# Patient Record
Sex: Male | Born: 1965
Health system: Southern US, Community
[De-identification: ages and names within clinical notes are randomized; demographics above are authoritative.]

## PROBLEM LIST (undated history)

## (undated) DIAGNOSIS — M1990 Unspecified osteoarthritis, unspecified site: Secondary | ICD-10-CM

## (undated) DIAGNOSIS — R32 Unspecified urinary incontinence: Secondary | ICD-10-CM

## (undated) DIAGNOSIS — G629 Polyneuropathy, unspecified: Secondary | ICD-10-CM

## (undated) DIAGNOSIS — K769 Liver disease, unspecified: Secondary | ICD-10-CM

## (undated) DIAGNOSIS — Y249XXA Unspecified firearm discharge, undetermined intent, initial encounter: Secondary | ICD-10-CM

## (undated) DIAGNOSIS — W3400XA Accidental discharge from unspecified firearms or gun, initial encounter: Secondary | ICD-10-CM

## (undated) DIAGNOSIS — M549 Dorsalgia, unspecified: Secondary | ICD-10-CM

## (undated) HISTORY — PX: ANKLE SURGERY: SHX546

## (undated) HISTORY — PX: HAND SURGERY: SHX662

## (undated) HISTORY — PX: BACK SURGERY: SHX140

## (undated) HISTORY — PX: HERNIA REPAIR: SHX51

## (undated) HISTORY — PX: KNEE SURGERY: SHX244

---

## 2007-05-29 ENCOUNTER — Ambulatory Visit (HOSPITAL_BASED_OUTPATIENT_CLINIC_OR_DEPARTMENT_OTHER): Admission: RE | Admit: 2007-05-29 | Discharge: 2007-05-29 | Payer: Self-pay | Admitting: Urology

## 2008-06-18 ENCOUNTER — Encounter: Payer: Self-pay | Admitting: Family Medicine

## 2009-02-14 ENCOUNTER — Ambulatory Visit: Payer: Self-pay | Admitting: Diagnostic Radiology

## 2009-02-14 ENCOUNTER — Emergency Department (HOSPITAL_BASED_OUTPATIENT_CLINIC_OR_DEPARTMENT_OTHER): Admission: EM | Admit: 2009-02-14 | Discharge: 2009-02-14 | Payer: Self-pay | Admitting: Emergency Medicine

## 2009-05-24 ENCOUNTER — Ambulatory Visit: Payer: Self-pay | Admitting: Occupational Medicine

## 2009-05-24 DIAGNOSIS — K4041 Unilateral inguinal hernia, with gangrene, recurrent: Secondary | ICD-10-CM | POA: Insufficient documentation

## 2009-05-30 ENCOUNTER — Ambulatory Visit: Payer: Self-pay | Admitting: Family Medicine

## 2009-05-30 DIAGNOSIS — M549 Dorsalgia, unspecified: Secondary | ICD-10-CM | POA: Insufficient documentation

## 2009-06-03 ENCOUNTER — Ambulatory Visit: Payer: Self-pay | Admitting: Family Medicine

## 2009-06-19 ENCOUNTER — Ambulatory Visit: Payer: Self-pay | Admitting: Family Medicine

## 2009-06-25 ENCOUNTER — Encounter: Payer: Self-pay | Admitting: Family Medicine

## 2009-07-15 ENCOUNTER — Encounter: Payer: Self-pay | Admitting: Family Medicine

## 2009-07-20 ENCOUNTER — Encounter: Payer: Self-pay | Admitting: Family Medicine

## 2009-08-11 ENCOUNTER — Encounter: Payer: Self-pay | Admitting: Family Medicine

## 2010-03-28 IMAGING — CR DG LUMBAR SPINE COMPLETE 4+V
6 series · 6 of 6 positions shown · non-contrast
Comparison: None.

CLINICAL DATA: Low back pain following a fall today.  Previous
lumbar spine surgery.

LUMBAR SPINE - COMPLETE 4+ VIEW

[t l-spine a.p.]
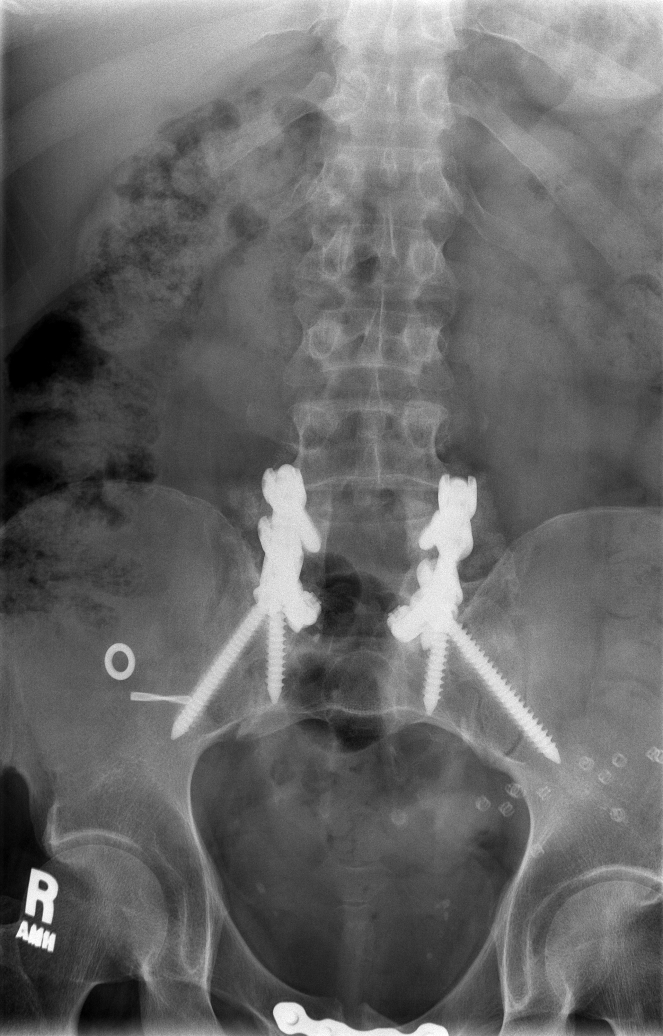

[t l-spine oblique exposure (1 of 2)]
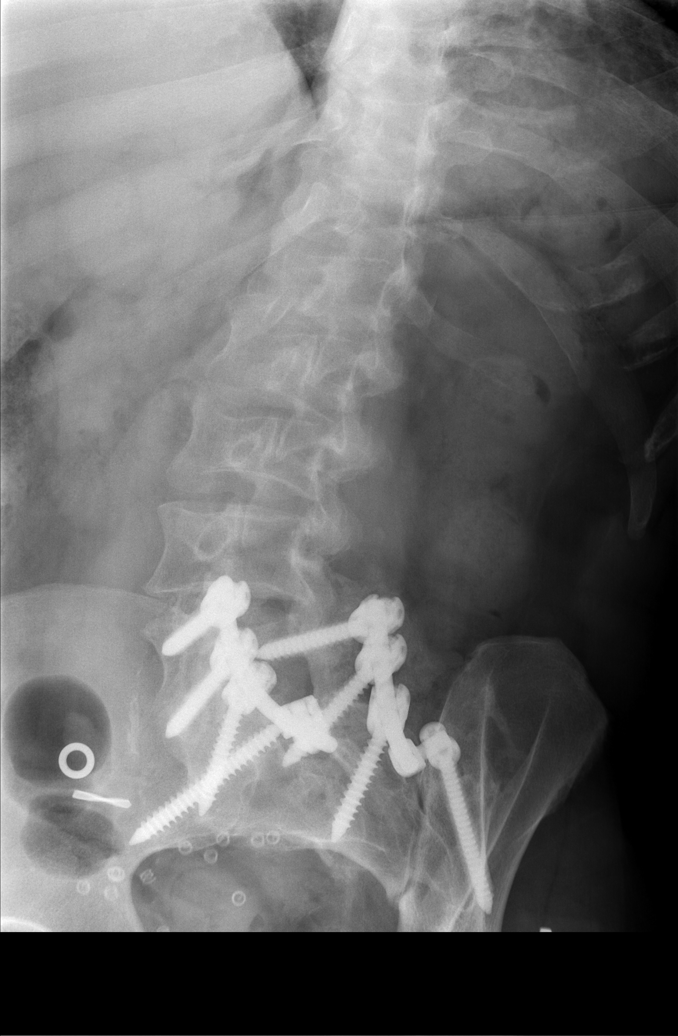

[t l-spine oblique exposure (2 of 2)]
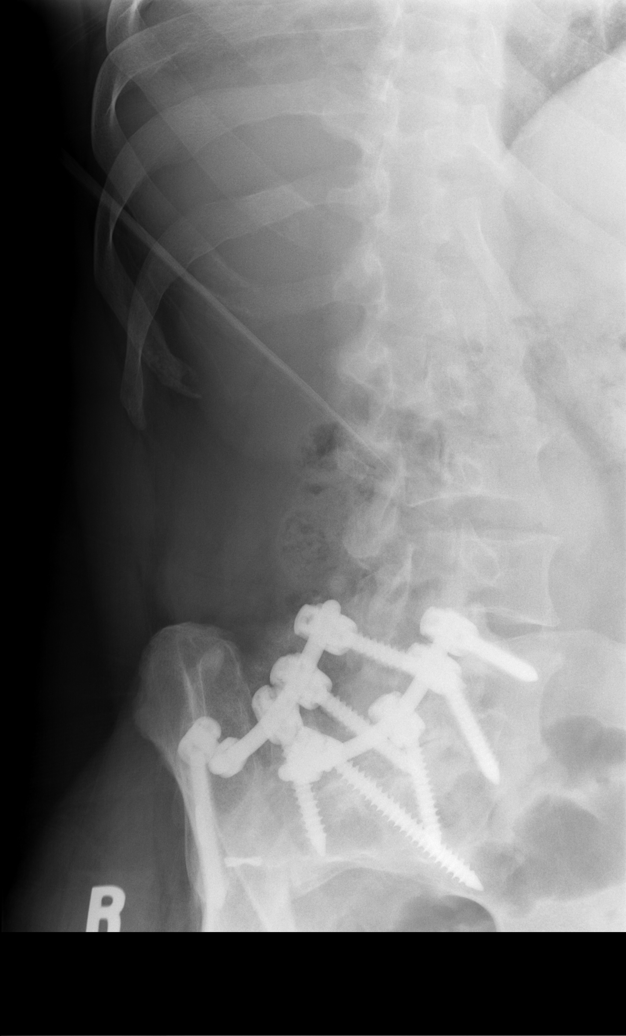

[t l-spine lat (1 of 2)]
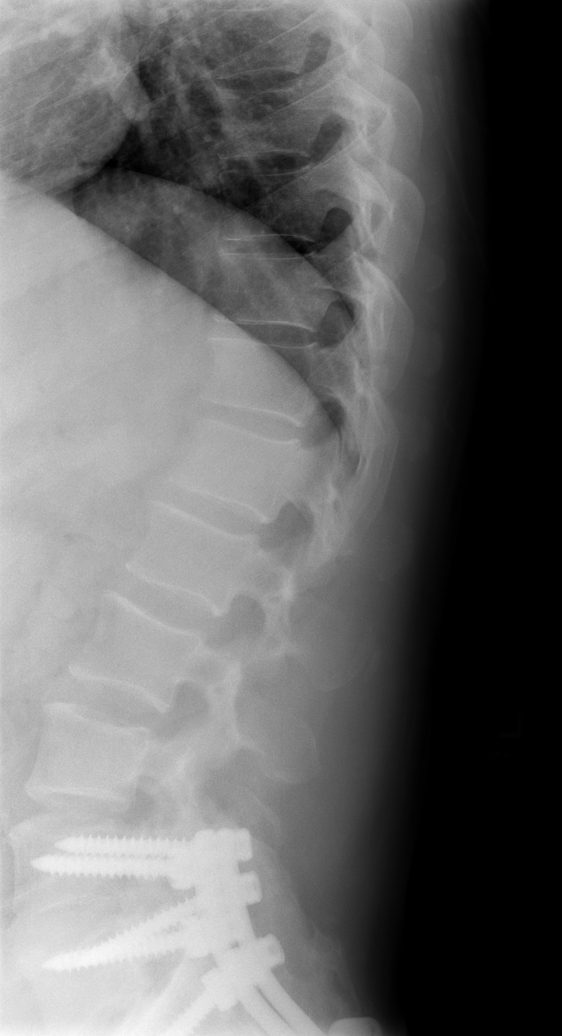

[t l-spine lat (2 of 2)]
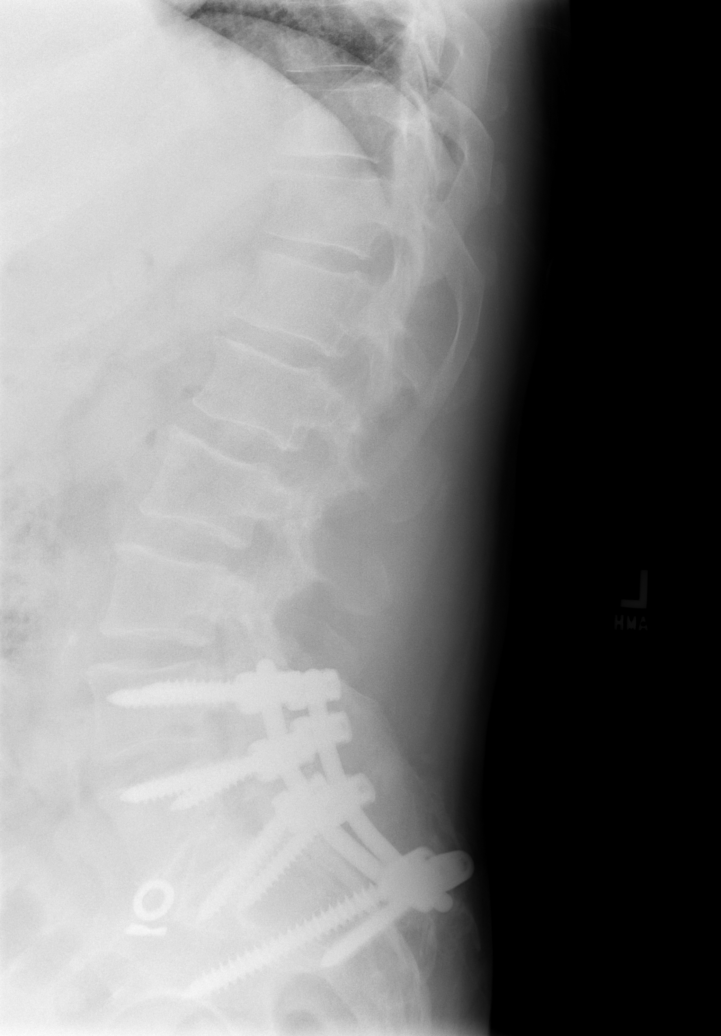

[t l-spine l5-s1 spot]
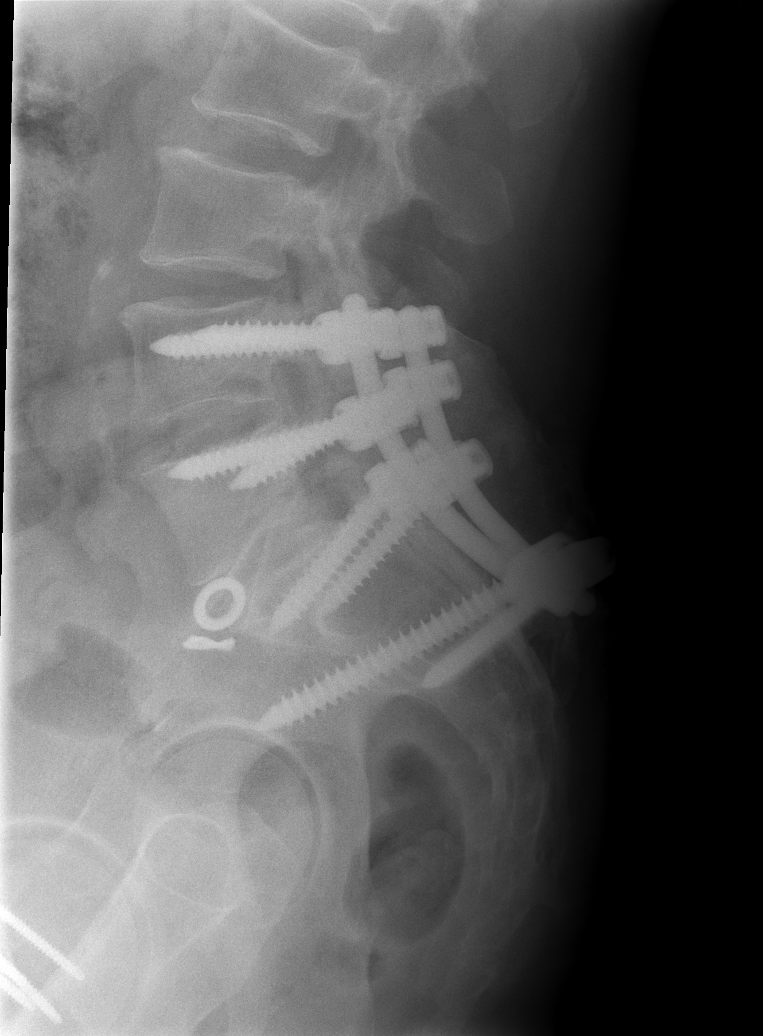

[6 of 6 positions shown; findings below may reference images not displayed]

FINDINGS: Five non-rib bearing lumbar vertebrae.  Bilateral L4
through S1 laminectomy defects with posterior bone fusion as well
as pedicle screw and rod fusion from the L4 to the S2 level.
Normal alignment.  Hernia repair mesh hardware projects over the
left pelvis.  Screw and plate fixation of the symphysis pubis.  No
acute fracture or subluxation.
IMPRESSION: Postoperative changes, as described above.  No fracture or
subluxation.

## 2010-08-17 NOTE — Letter (Signed)
Summary: [facility]   Imported By: Lanelle Bal 08/20/2009 10:01:03  _____________________________________________________________________  External Attachment:    Type:   Image     Comment:   External Document

## 2010-08-17 NOTE — Letter (Signed)
Summary: [facility]   Imported By: Lanelle Bal 07/24/2009 09:34:25  _____________________________________________________________________  External Attachment:    Type:   Image     Comment:   External Document

## 2010-08-17 NOTE — Letter (Signed)
Summary: [facility]   Imported By: Lanelle Bal 07/24/2009 08:39:17  _____________________________________________________________________  External Attachment:    Type:   Image     Comment:   External Document

## 2010-11-30 NOTE — Op Note (Signed)
NAMESAMARTH, OGLE NO.:  [id number]   MEDICAL RECORD NO.:  [id number]          PATIENT TYPE:  AMB   LOCATION:  [facility]                         FACILITY:  [facility]   PHYSICIAN:  Martina Sinner, MD DATE OF BIRTH:  07-29-1965   DATE OF PROCEDURE:  05/29/2007  DATE OF DISCHARGE:                               OPERATIVE REPORT   PREOPERATIVE DIAGNOSES:  1. Muscle spasm.  2. Neurogenic bladder.  3. Pain with catheterization.   POSTOPERATIVE DIAGNOSES:  1. Muscle spasm.  2. Neurogenic bladder.  3. Pain with catheterization.  4. Mild meatal stenosis.   [name] has neurogenic bladder.  I know him well.  He is having  relentless pain with catheterization.  He consented to Botox treatment  of the external sphincter hoping to break the pain cycle possibly  secondary to muscle spasm.   The patient is prepped and draped in the usual fashion.  The ACMI scope  could not enter the meatus due to mild meatal stenosis and the blunt  nature of the scope.  I dilated with male sounds at 26-French.  I then  scoped down to the external sphincter.  He had a very mild stricture  distal to the sphincter of approximately 24-French.  It would not allow  me to negotiate the ACMI injection scope through it.  I therefore  switched to a 22-French cystoscope and used the disposable collagen  needle.   His prostatic urethra was normal.  His verumontanum was easily  identified.  You could see his external sphincter.  He had grade 1-2/4  bladder trabeculation but no other mucosal abnormalities.   Under cystoscopic guidance, I injected in four quadrants 80 mL of Botox  instilled in 4 mL of normal saline.  I injected approximately 1 mL in  each quadrant.  There was little to no bleeding.  At the patient's  request, I thought it was also reasonable to leave a Foley catheter in  for three days afterwards.  I will take the Foley catheter out on  Friday.  I will keep him on ciprofloxacin  until Saturday or Sunday.  I  gave him some pain medication as requested prior to surgery.           ______________________________  Martina Sinner, MD  Electronically Signed     SAM/MEDQ  D:  05/29/2007  T:  05/30/2007  Job:  [id number]

## 2011-04-26 LAB — POCT HEMOGLOBIN-HEMACUE
Hemoglobin: 17.7 — ABNORMAL HIGH
Operator id: 114531

## 2012-05-27 ENCOUNTER — Emergency Department (HOSPITAL_BASED_OUTPATIENT_CLINIC_OR_DEPARTMENT_OTHER)
Admission: EM | Admit: 2012-05-27 | Discharge: 2012-05-27 | Disposition: A | Discharge status: Home / Self Care | Payer: Medicare Other | Attending: Emergency Medicine | Admitting: Emergency Medicine

## 2012-05-27 ENCOUNTER — Encounter (HOSPITAL_BASED_OUTPATIENT_CLINIC_OR_DEPARTMENT_OTHER): Payer: Self-pay | Admitting: *Deleted

## 2012-05-27 DIAGNOSIS — Z79899 Other long term (current) drug therapy: Secondary | ICD-10-CM | POA: Insufficient documentation

## 2012-05-27 DIAGNOSIS — R32 Unspecified urinary incontinence: Secondary | ICD-10-CM | POA: Insufficient documentation

## 2012-05-27 DIAGNOSIS — M549 Dorsalgia, unspecified: Secondary | ICD-10-CM

## 2012-05-27 DIAGNOSIS — Z791 Long term (current) use of non-steroidal anti-inflammatories (NSAID): Secondary | ICD-10-CM | POA: Insufficient documentation

## 2012-05-27 DIAGNOSIS — G609 Hereditary and idiopathic neuropathy, unspecified: Secondary | ICD-10-CM | POA: Insufficient documentation

## 2012-05-27 DIAGNOSIS — K769 Liver disease, unspecified: Secondary | ICD-10-CM | POA: Insufficient documentation

## 2012-05-27 DIAGNOSIS — M545 Low back pain, unspecified: Secondary | ICD-10-CM | POA: Insufficient documentation

## 2012-05-27 DIAGNOSIS — Z7982 Long term (current) use of aspirin: Secondary | ICD-10-CM | POA: Insufficient documentation

## 2012-05-27 DIAGNOSIS — F17200 Nicotine dependence, unspecified, uncomplicated: Secondary | ICD-10-CM | POA: Insufficient documentation

## 2012-05-27 DIAGNOSIS — Z8739 Personal history of other diseases of the musculoskeletal system and connective tissue: Secondary | ICD-10-CM | POA: Insufficient documentation

## 2012-05-27 HISTORY — DX: Polyneuropathy, unspecified: G62.9

## 2012-05-27 HISTORY — DX: Unspecified osteoarthritis, unspecified site: M19.90

## 2012-05-27 HISTORY — DX: Liver disease, unspecified: K76.9

## 2012-05-27 HISTORY — DX: Accidental discharge from unspecified firearms or gun, initial encounter: W34.00XA

## 2012-05-27 HISTORY — DX: Unspecified urinary incontinence: R32

## 2012-05-27 HISTORY — DX: Dorsalgia, unspecified: M54.9

## 2012-05-27 HISTORY — DX: Unspecified firearm discharge, undetermined intent, initial encounter: Y24.9XXA

## 2012-05-27 MED ORDER — HYDROCODONE-IBUPROFEN 7.5-200 MG PO TABS: 1.0000 | ORAL_TABLET | Freq: Four times a day (QID) | ORAL | Status: AC | PRN

## 2012-05-27 MED ORDER — OXYCODONE-ACETAMINOPHEN 5-325 MG PO TABS: 1.0000 | ORAL_TABLET | Freq: Four times a day (QID) | ORAL | Status: AC | PRN
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
## 2012-05-27 NOTE — ED Notes (Signed)
Pt reports he fell on 11/1 and was seen in emergency room. Has appt with ortho tomorrow but is out of pain meds

## 2012-05-27 NOTE — ED Notes (Signed)
Pt states he fell earlier this month and "broke the rods in his back". Appt tomorrow and "just can't take it anymore".

## 2012-05-27 NOTE — ED Notes (Signed)
MD at bedside. 

## 2012-06-22 ENCOUNTER — Encounter (HOSPITAL_BASED_OUTPATIENT_CLINIC_OR_DEPARTMENT_OTHER): Payer: Self-pay | Admitting: *Deleted

## 2012-06-22 ENCOUNTER — Emergency Department (HOSPITAL_BASED_OUTPATIENT_CLINIC_OR_DEPARTMENT_OTHER)
Admission: EM | Admit: 2012-06-22 | Discharge: 2012-06-22 | Disposition: A | Discharge status: Home / Self Care | Payer: Medicare Other | Attending: Emergency Medicine | Admitting: Emergency Medicine

## 2012-06-22 DIAGNOSIS — F17200 Nicotine dependence, unspecified, uncomplicated: Secondary | ICD-10-CM | POA: Insufficient documentation

## 2012-06-22 DIAGNOSIS — Z87828 Personal history of other (healed) physical injury and trauma: Secondary | ICD-10-CM | POA: Insufficient documentation

## 2012-06-22 DIAGNOSIS — Z8739 Personal history of other diseases of the musculoskeletal system and connective tissue: Secondary | ICD-10-CM | POA: Insufficient documentation

## 2012-06-22 DIAGNOSIS — Z791 Long term (current) use of non-steroidal anti-inflammatories (NSAID): Secondary | ICD-10-CM | POA: Insufficient documentation

## 2012-06-22 DIAGNOSIS — Z79899 Other long term (current) drug therapy: Secondary | ICD-10-CM | POA: Insufficient documentation

## 2012-06-22 DIAGNOSIS — M549 Dorsalgia, unspecified: Secondary | ICD-10-CM | POA: Insufficient documentation

## 2012-06-22 DIAGNOSIS — Z7982 Long term (current) use of aspirin: Secondary | ICD-10-CM | POA: Insufficient documentation

## 2012-06-22 DIAGNOSIS — Z8379 Family history of other diseases of the digestive system: Secondary | ICD-10-CM | POA: Insufficient documentation

## 2012-06-22 DIAGNOSIS — Z760 Encounter for issue of repeat prescription: Secondary | ICD-10-CM | POA: Insufficient documentation

## 2012-06-22 MED ORDER — OXYCODONE HCL 10 MG PO TABS: 10.0000 mg | ORAL_TABLET | Freq: Four times a day (QID) | ORAL | Status: AC | PRN

## 2012-06-22 NOTE — ED Notes (Signed)
Pt reports he cant take percocet, which his ortho doc prescribed, due to liver damage from a past fall. Requesting new rx- states he is supposed to have surgery in jan to replace broken hardware in his back
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
## 2018-02-09 ENCOUNTER — Encounter (HOSPITAL_BASED_OUTPATIENT_CLINIC_OR_DEPARTMENT_OTHER): Payer: Self-pay | Admitting: *Deleted

## 2018-02-09 ENCOUNTER — Emergency Department (HOSPITAL_BASED_OUTPATIENT_CLINIC_OR_DEPARTMENT_OTHER)
Admission: EM | Admit: 2018-02-09 | Discharge: 2018-02-09 | Disposition: A | Discharge status: Home / Self Care | Payer: Medicare Other | Attending: Emergency Medicine | Admitting: Emergency Medicine

## 2018-02-09 ENCOUNTER — Other Ambulatory Visit: Payer: Self-pay

## 2018-02-09 DIAGNOSIS — Z760 Encounter for issue of repeat prescription: Secondary | ICD-10-CM | POA: Diagnosis not present

## 2018-02-09 DIAGNOSIS — Z79899 Other long term (current) drug therapy: Secondary | ICD-10-CM | POA: Insufficient documentation

## 2018-02-09 DIAGNOSIS — Z7982 Long term (current) use of aspirin: Secondary | ICD-10-CM | POA: Insufficient documentation

## 2018-02-09 MED ORDER — GABAPENTIN 800 MG PO TABS: 800.0000 mg | ORAL_TABLET | Freq: Three times a day (TID) | ORAL | 0 refills | Status: AC

## 2018-02-09 MED FILL — GABAPENTIN 800 MG TABLET: 800 | 14 days supply | Qty: 42 | Fill #0

## 2018-02-09 NOTE — ED Triage Notes (Signed)
States he lost his rx for Gabapentin and needs a Rx.
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
## 2018-02-09 NOTE — Discharge Instructions (Signed)
You were seen for medication refill of gabapentin.  New changes to controlled substance policies will begin soon and gabapentin will potentially be considered a controlled substance and we will be unable to continue refilling this medication for you in the emergency department.  Recommend establish care with a primary care doctor as soon as possible for long-term refills.
